# Patient Record
Sex: Female | Born: 1960 | Race: Black or African American | Hispanic: No | Marital: Single | Smoking: Never smoker
Health system: Southern US, Community
[De-identification: ages and names within clinical notes are randomized; demographics above are authoritative.]

## PROBLEM LIST (undated history)

## (undated) DIAGNOSIS — E139 Other specified diabetes mellitus without complications: Secondary | ICD-10-CM

## (undated) DIAGNOSIS — I10 Essential (primary) hypertension: Secondary | ICD-10-CM

## (undated) HISTORY — PX: BREAST SURGERY: SHX581

---

## 2005-11-19 ENCOUNTER — Emergency Department (HOSPITAL_COMMUNITY): Admission: EM | Admit: 2005-11-19 | Discharge: 2005-11-19 | Payer: Self-pay | Admitting: Emergency Medicine

## 2007-07-22 ENCOUNTER — Emergency Department (HOSPITAL_COMMUNITY): Admission: EM | Admit: 2007-07-22 | Discharge: 2007-07-22 | Payer: Self-pay | Admitting: Emergency Medicine

## 2011-02-08 LAB — URINE MICROSCOPIC-ADD ON

## 2011-02-08 LAB — URINE CULTURE: Colony Count: >=100000

## 2011-02-08 LAB — PREGNANCY, URINE: Preg Test, Ur: NEGATIVE

## 2011-02-08 LAB — URINALYSIS, ROUTINE W REFLEX MICROSCOPIC
Glucose, UA: NEGATIVE
Ketones, ur: NEGATIVE
Protein, ur: NEGATIVE
pH: 6

## 2013-01-26 ENCOUNTER — Emergency Department (HOSPITAL_COMMUNITY)
Admission: EM | Admit: 2013-01-26 | Discharge: 2013-01-26 | Disposition: A | Discharge status: Home / Self Care | Payer: Self-pay | Attending: Emergency Medicine | Admitting: Emergency Medicine

## 2013-01-26 ENCOUNTER — Encounter (HOSPITAL_COMMUNITY): Payer: Self-pay | Admitting: Cardiology

## 2013-01-26 DIAGNOSIS — S5010XA Contusion of unspecified forearm, initial encounter: Secondary | ICD-10-CM | POA: Insufficient documentation

## 2013-01-26 DIAGNOSIS — S5011XA Contusion of right forearm, initial encounter: Secondary | ICD-10-CM

## 2013-01-26 DIAGNOSIS — S0990XA Unspecified injury of head, initial encounter: Secondary | ICD-10-CM | POA: Insufficient documentation

## 2013-01-26 DIAGNOSIS — I10 Essential (primary) hypertension: Secondary | ICD-10-CM | POA: Insufficient documentation

## 2013-01-26 HISTORY — DX: Essential (primary) hypertension: I10
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
## 2013-01-26 NOTE — ED Notes (Addendum)
Pts whereabouts remain unknown. NP aware of pts likely elopement after being seen by the NP. Pt did not state intention to leave to any medical or nursing staff present.

## 2013-01-26 NOTE — ED Notes (Signed)
Upon entering pts room pt noted to not be in room. Immediate area searched for pt without success. Will continue to look for pt.
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
## 2013-01-27 NOTE — ED Provider Notes (Signed)
Medical screening examination/treatment/procedure(s) were performed by non-physician practitioner and as supervising physician I was immediately available for consultation/collaboration.   William Eutha Cude, MD 01/27/13 0041 

## 2014-07-01 ENCOUNTER — Ambulatory Visit
Admit: 2014-07-01 | Discharge: 2014-07-01 | Payer: PRIVATE HEALTH INSURANCE | Attending: Family Medicine | Primary: Family Medicine

## 2014-07-01 ENCOUNTER — Inpatient Hospital Stay: Admit: 2014-07-01 | Payer: MEDICAID | Primary: Family Medicine

## 2014-07-01 DIAGNOSIS — M79661 Pain in right lower leg: Secondary | ICD-10-CM

## 2014-07-01 DIAGNOSIS — M7989 Other specified soft tissue disorders: Secondary | ICD-10-CM

## 2014-07-01 MED ORDER — TRIMETHOPRIM-SULFAMETHOXAZOLE 160 MG-800 MG TAB
160-800 mg | ORAL_TABLET | Freq: Two times a day (BID) | ORAL | Status: AC
Start: 2014-07-01 — End: 2014-07-11

## 2014-07-01 MED ORDER — IBUPROFEN 600 MG TAB
600 mg | ORAL_TABLET | Freq: Four times a day (QID) | ORAL | Status: DC | PRN
Start: 2014-07-01 — End: 2014-07-09

## 2014-07-01 NOTE — Progress Notes (Signed)
HISTORY OF PRESENT ILLNESS  Leslie Cole is a 54 y.o. female.  Skin Problem  The history is provided by the patient. This is a new problem. The current episode started yesterday. The problem occurs constantly. The problem has been rapidly worsening. Pertinent negatives include no shortness of breath. Associated symptoms comments: Right lower leg is swelled and itchy. Nothing aggravates the symptoms. Nothing relieves the symptoms. She has tried nothing for the symptoms. The treatment provided no relief.   on right lower leg, noticed a place on back of leg that has has begun to rapidly swell since last night.  Pt denies any smoking, denies any birth control usage.  She does admit to driving from [place] in December.      Review of Systems   Constitutional: Negative for fever, chills and malaise/fatigue.   Respiratory: Negative.  Negative for shortness of breath.    Cardiovascular: Negative.    Gastrointestinal: Negative.    Musculoskeletal: Negative for myalgias.   Skin: Positive for itching. Negative for rash.   Neurological: Negative.  Negative for tingling and focal weakness.       BP 126/73 mmHg   Pulse 51   Temp(Src) 98.1 ??F (36.7 ??C) (Tympanic)   Resp 20   Ht 5\' 2"  (1.575 m)   Wt 248 lb 6.4 oz (112.674 kg)   BMI 45.42 kg/m2   SpO2 97%  Physical Exam   Constitutional: She appears well-developed and well-nourished. She is active and cooperative. She does not have a sickly appearance.   HENT:   Head: Normocephalic and atraumatic.   Eyes: No scleral icterus.   Cardiovascular: Normal rate, regular rhythm and normal heart sounds.    Pulmonary/Chest: Effort normal and breath sounds normal. No respiratory distress. She has no wheezes.   Musculoskeletal: She exhibits tenderness. She exhibits no edema.        Right lower leg: She exhibits tenderness and swelling. She exhibits no edema, no deformity and no laceration.        Legs:  Neurological: She is alert. She exhibits normal muscle tone.    Skin: Skin is warm and dry. No rash noted. There is erythema. No pallor.   Nursing note and vitals reviewed.    Discussed symptoms and condition with pt and recommended to her that we need to consider blood clot.  She desires to have LE US.  Pt will go to straight to [facility] for US tonight.  US called and they said that they would be there until 630pm.        ASSESSMENT and PLAN  Encounter Diagnoses   Name Primary?   ??? Pain and swelling of right lower leg Yes   ??? Cellulitis of right lower leg      Orders Placed This Encounter   ??? DUPLEX LOWER EXT VENOUS RIGHT   ??? trimethoprim-sulfamethoxazole (BACTRIM DS, SEPTRA DS) 160-800 mg per tablet   ??? ibuprofen (MOTRIN) 600 mg tablet     Follow-up Disposition:  Return if symptoms worsen or fail to improve.

## 2014-07-01 NOTE — Patient Instructions (Signed)
Pt to go to [facility] for [place] right LE  Fluids  Rest  Motrin or tylenol prn fever or pain  F/U with PCP in 3-5 days.  Should symptoms not improve or should they worsen, notify Primary Care Provider and/or go to ED  Take Rx as prescribed  Cellulitis: Care Instructions  Your Care Instructions     Cellulitis is a skin infection. It often occurs after a break in the skin from a scrape, cut, bite, or puncture, or after a rash.  The doctor has checked you carefully, but problems can develop later. If you notice any problems or new symptoms, get medical treatment right away.  Follow-up care is a key part of your treatment and safety. Be sure to make and go to all appointments, and call your doctor if you are having problems. It's also a good idea to know your test results and keep a list of the medicines you take.  How can you care for yourself at home?  ?? Take your antibiotics as directed. Do not stop taking them just because you feel better. You need to take the full course of antibiotics.  ?? Prop up the infected area on pillows to reduce pain and swelling. Try to keep the area above the level of your heart as often as you can.  ?? If your doctor told you how to care for your wound, follow your doctor's instructions. If you did not get instructions, follow this general advice:  ?? Wash the wound with clean water 2 times a day. Don't use hydrogen peroxide or alcohol, which can slow healing.  ?? You may cover the wound with a thin layer of petroleum jelly, such as Vaseline, and a nonstick bandage.  ?? Apply more petroleum jelly and replace the bandage as needed.  ?? Be safe with medicines. Take pain medicines exactly as directed.  ?? If the doctor gave you a prescription medicine for pain, take it as prescribed.  ?? If you are not taking a prescription pain medicine, ask your doctor if you can take an over-the-counter medicine.  To prevent cellulitis in the future   ?? Try to prevent cuts, scrapes, or other injuries to your skin. Cellulitis most often occurs where there is a break in the skin.  ?? If you get a scrape, cut, mild burn, or bite, wash the wound with clean water as soon as you can to help avoid infection. Don't use hydrogen peroxide or alcohol, which can slow healing.  ?? If you have swelling in your legs (edema), support stockings and good skin care may help prevent leg sores and cellulitis.  ?? Take care of your feet, especially if you have diabetes or other conditions that increase the risk of infection. Wear shoes and socks. Do not go barefoot. If you have athlete's foot or other skin problems on your feet, talk to your doctor about how to treat them.  When should you call for help?  Call your doctor now or seek immediate medical care if:  ?? You have signs that your infection is getting worse, such as:  ?? Increased pain, swelling, warmth, or redness.  ?? Red streaks leading from the area.  ?? Pus draining from the area.  ?? A fever.  ?? You get a rash.  Watch closely for changes in your health, and be sure to contact your doctor if:  ?? You are not getting better after 1 day (24 hours).  ?? You do not get better as  expected.   Where can you learn more?   Go to [email or web address]  Enter X309 in the search box to learn more about "Cellulitis: Care Instructions."   ?? 2006-2015 [employer]. Care instructions adapted under license by [employer] (which disclaims liability or warranty for this information). This care instruction is for use with your licensed healthcare professional. If you have questions about a medical condition or this instruction, always ask your healthcare professional. [employer]wise, Incorporated disclaims any warranty or liability for your use of this information.  Content Version: 10.7.482551; Current as of: July 06, 2013              Learning About Deep Vein Thrombosis  What is deep vein thrombosis?      A deep vein thrombosis (DVT) is a blood clot in certain veins of the legs, pelvis, or arms. The clot is usually in the legs. DVT may damage the vein and cause the area to ache, swell, and change color. DVT also can lead to sores.  DVT in these veins needs to be treated because the clots can get bigger, break loose, and travel through the bloodstream to the lungs. A blood clot in a lung can cause death.  Blood clots can form in the veins when you are not active for a long period of time. For example, they can form if you need to stay in bed because of a health problem or must sit for a long time on an airplane or in a car. Surgery or an injury can damage your blood vessels and cause a clot to form. Cancer also can cause DVT. And some people have blood that clots too easily, which is a problem that may run in families.  A risk factor is something that makes you more likely to develop a disease.  Here are some major risk factors for DVT:  ?? You have surgery.  ?? You have to stay in bed for more than 3 days (such as in the hospital).  ?? Your blood is likely to clot because of an injury, cancer, or inherited condition.  Here are some minor risk factors for DVT:  ?? You take birth control hormones.  ?? You are pregnant.  ?? You are in a car or airplane for a long trip.  What are the symptoms?  Symptoms of DVT may include:  ?? Swelling in the affected area.  ?? Redness and warmth in the affected area.  ?? Pain or tenderness. You may have pain only when you touch the affected area or when you stand or walk.  If your doctor thinks you may have DVT, you will probably have an ultrasound test. You may have other tests as well.  How can you prevent DVT?  ?? Exercise your lower leg muscles to help blood flow in your legs. Point your toes up toward your head so the calves of your legs are stretched, then relax and repeat. This is a good exercise to do when you are sitting for long periods of time.   ?? Get out of bed as soon as you can after an illness or surgery. If you need to stay in bed, do the leg exercise noted above every hour when you are awake.  ?? Use special stockings called compression stockings. These stockings are tight at the feet with a gradually looser fit on the leg. Many doctors recommend that you wear compression stockings during a journey longer than 8 hours.  ?? Take breaks when you  are on long trips. Stop the car and walk around. On long airplane flights, walk up and down the aisle hourly, flex and point your feet every 20 minutes while sitting, and drink plenty of water.  ?? Take blood-thinning medicines after some types of surgery if your doctor recommends it. Blood thinners also may be used if you are likely to develop clots.  How is DVT treated?  Treatment for DVT usually involves taking blood thinners. These medicines are given through a vein (intravenously, or IV) or as a pill. Talk with your doctor about which medicine is right for you.  Your doctor also may suggest that you prop up or elevate your leg when possible, take walks, and wear compression stockings. These measures may help reduce the pain and swelling that can happen with DVT.  Follow-up care is a key part of your treatment and safety. Be sure to make and go to all appointments, and call your doctor if you are having problems. It's also a good idea to know your test results and keep a list of the medicines you take.   Where can you learn more?   Go to [email or web address]  Enter X941 in the search box to learn more about "Learning About Deep Vein Thrombosis."   ?? 2006-2015 [employer], [employer]. Care instructions adapted under license by [employer] (which disclaims liability or warranty for this information). This care instruction is for use with your licensed healthcare professional. If you have questions about a medical condition or this instruction, always ask your healthcare professional. [employer]  [employer]corpor[employer] disclaims any warranty or liability for your use of this information.  Content Version: 10.7.482551; Current as of: January 04, 2014              Deep Vein Thrombosis: Care Instructions  Your Care Instructions     A deep vein thrombosis (DVT) is a blood clot in certain veins of the legs, pelvis, or arms. Blood clots in these veins need to be treated because they can get bigger, break loose, and travel through the bloodstream to the lungs. A blood clot in a lung can be life-threatening.  The doctor may have given you a blood thinner (anticoagulant). A blood thinner can stop the blood clot from growing larger and prevent new clots from forming. You will need to take a blood thinner for 3 to 6 months or longer.  The doctor has checked you carefully, but problems can develop later. If you notice any problems or new symptoms, get medical treatment right away.  Follow-up care is a key part of your treatment and safety. Be sure to make and go to all appointments, and call your doctor if you are having problems. It's also a good idea to know your test results and keep a list of the medicines you take.  How can you care for yourself at home?  ?? Take your medicines exactly as prescribed. Call your doctor if you think you are having a problem with your medicine.  ?? If you are taking a blood thinner, be sure you get instructions about how to take your medicine safely. Blood thinners can cause serious bleeding problems.  ?? Wear compression stockings if your doctor recommends them. These stockings are tighter at the feet than on the legs. They may reduce pain and swelling in your legs. You can get them with a prescription at a medical supply store or at some drugstores.  ?? Raise your leg or arm on a pillow when sitting.  Try to keep it above the level of your heart.  ?? Use a heating pad for 20 to 30 minutes 3 or 4 times a day to reduce pain and swelling.  When should you call for help?   Call 911 anytime you think you may need emergency care. For example, call if:  ?? You passed out (lost consciousness).  ?? You have symptoms of a blood clot in your lung (called a pulmonary embolism). These include:  ?? Sudden chest pain.  ?? Trouble breathing.  ?? Coughing up blood.  Call your doctor now or seek immediate medical care if:  ?? You have new or worse trouble breathing.  ?? You are dizzy or lightheaded, or you feel like you may faint.  ?? You have symptoms of a blood clot in your arm or leg. These may include:  ?? Pain in the arm, calf, back of the knee, thigh, or groin.  ?? Redness and swelling in the arm, leg, or groin.  Watch closely for changes in your health, and be sure to contact your doctor if:  ?? You do not get better as expected.   Where can you learn more?   Go to [email or web address]  Enter [phone] in the search box to learn more about "Deep Vein Thrombosis: Care Instructions."   ?? 2006-2015 [employer]. Care instructions adapted under license by [employer] (which disclaims liability or warranty for this information). This care instruction is for use with your licensed healthcare professional. If you have questions about a medical condition or this instruction, always ask your healthcare professional. [employer], [employer] disclaims any warranty or liability for your use of this information.  Content Version: 10.7.482551; Current as of: January 04, 2014              Learning About How to Prevent Blood Clots  What is a blood clot?     A blood clot is a clump of blood that forms in a blood vessel, such as a vein or an artery. If a clot gets stuck in a blood vessel, it can cause serious problems like a deep vein thrombosis (DVT) or a pulmonary embolism.  A DVT is a blood clot in certain veins of the legs, pelvis, or arms. It most often occurs in the legs. Blood clots in these veins need to be treated, because they can get bigger, break loose, and travel through the  bloodstream to the heart and then to the lungs. This causes a pulmonary embolism.  A pulmonary embolism is a sudden blockage of an artery in the lung. Blood clots in the deep veins of the leg are the most common cause of a pulmonary embolism. In many cases, the clots are small. They may damage the lung. But if the clot is large and stops blood flow to the lung, it can be deadly.  What increases your risk for blood clots?  Some of the things that can increase your risk for a blood clot include:  Slowed blood flow  When blood doesn't flow normally, clots are more likely to develop. Reduced blood flow may result from long-term bed rest, such as after a surgery, injury, or serious illness. Or it may result from sitting for a long time, especially when traveling long distances.  Abnormal clotting  Some people have blood that clots too easily or too quickly. Problems that may cause increased clotting include:  ?? Having certain blood problems that make blood clot too easily. This is a problem that  may run in families.  ?? Having certain health problems, such as cancer, heart failure, stroke, or severe infection.  ?? Being pregnant. A woman's risk of getting blood clots increases both during pregnancy and shortly after delivery or after a cesarean section.  ?? Using hormonal forms of birth control or hormone therapy.  ?? Smoking.  Injury to the blood vessel wall  Blood is more likely to clot in veins and arteries shortly after they are injured. Injury can be caused by a recent medical procedure or surgery that involved your legs, hips, belly, or brain. Or it can be caused by an injury, such as a broken hip.  What can you do to prevent blood clots?  After any procedure or event that increases your risk  ?? Take a blood-thinning medicine (called an anticoagulant) as directed if your doctor prescribes one.  ?? Exercise  your lower leg muscles to help keep the blood moving through  your legs. Point your toes up toward your head so the calves of your legs are stretched, then relax. Repeat. This is a good exercise to do when you are sitting for long periods of time.  ?? Get up out of bed as soon as you safely can or as soon as your doctor says it's okay after an illness or surgery. If you can't get out of bed, you can do the leg exercise described above. Try to do this leg exercise every hour when you are awake. This will help keep the blood moving through your legs. If you are in the hospital and need to stay in bed, your doctor may have you use a special device that inflates and deflates knee-high boots to help keep blood from pooling in your legs.  ?? Use compression stockings if your doctor prescribes them. These are specially fitted stockings that may prevent blood clots by keeping blood from pooling in your legs.  When you travel  ?? Take breaks when you travel. On long car trips, stop the car and walk around every hour or so. On long bus or train rides or plane flights, get out of your seat and walk up and down the aisle every hour or so.  ?? Do leg exercises while you are seated. For example, pump your feet up and down by pulling your toes up toward your knees and then pointing them down.  If you already have a risk of blood clots, talk to your doctor before taking a long trip. Your doctor may want you to wear compression stockings or take blood-thinning medicine.  Take care of your body  ?? Be active. Try to get 30 minutes or more of activity on most days of the week.  ?? Don't smoke. Smoking can increase your risk of blood clots. If you need help quitting, talk to your doctor about stop-smoking programs and medicines.  ?? Check with your doctor about whether you should use hormonal forms of birth control or hormone therapy. These may increase your risk of blood clots.  When should you call for help?  Call 911 anytime you think you may need emergency care. For example, call if:   ?? You have symptoms of a blood clot in your lung (called a pulmonary embolism). These may include:  ?? Sudden chest pain.  ?? Trouble breathing.  ?? Coughing up blood.  Call your doctor now or seek immediate medical care if:  ?? You have symptoms of a blood clot in your arm or leg (called a  deep vein thrombosis). These may include:  ?? Pain in the arm, calf, back of the knee, thigh, or groin.  ?? Redness and swelling in the arm, leg, or groin.   Where can you learn more?   Go to [email or web address]  Enter F229 in the search box to learn more about "Learning About How to Prevent Blood Clots."   ?? 2006-2015 [employer]. Care instructions adapted under license by [employer] (which disclaims liability or warranty for this information). This care instruction is for use with your licensed healthcare professional. If you have questions about a medical condition or this instruction, always ask your healthcare professional. [employer] disclaims any warranty or liability for your use of this information.  Content Version: 10.7.482551; Current as of: January 04, 2014

## 2014-07-09 ENCOUNTER — Ambulatory Visit
Admit: 2014-07-09 | Discharge: 2014-07-09 | Payer: PRIVATE HEALTH INSURANCE | Attending: Family | Primary: Family Medicine

## 2014-07-09 ENCOUNTER — Inpatient Hospital Stay: Admit: 2014-07-09 | Payer: MEDICAID | Primary: Family Medicine

## 2014-07-09 NOTE — Progress Notes (Addendum)
[facility]- [facility] Progress Note                      Domingo Pulse, APRN    Leslie Cole 54 y.o. female   [id number]   [place]:  07/09/2014    Chief Complaint(s):   Chief Complaint   Patient presents with   ??? 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 She noted a hx of sleep apnea, She also has c/o gerd symptoms as well as reflux with actual food in mouth in the night that wakes her up. She is concerned about her obesity and would like to lose weight. She has a goal of 200 lb. She has had some stressful relationships that she states has gained weight as a result. The pt states she has not had menses in over a year. She has hot flashes, mood swings, and increased appetite.  Highest hgba1c in the past has been elevated.        Review of Systems:  Fever: no  Runny Nose/Congestion: no   Congested:  no  Cough:   no  Wheezing/CP/SOB: no  Otalgia: no  Sore Throat:   no  Headache:  no   Nausea/Vomiting:    no  Diarrhea:   no  Constipation:  occasional  Dysuria:   no  Rash:  no  Appetite: unchanged  Fluid Intake:  unchanged  Urine Output: unchanged     Past Medical History:    Past Medical History   Diagnosis Date   ??? Diabetes 1.5, managed as type 2 ([place])    ??? Hyperlipidemia        Allergies: No Known Allergies    Family History Reviewed at This Visit: family history includes Cancer in her mother, sister, and sister; Diabetes in her father and mother; Heart Disease in her father and mother. There is no history of Lung Disease.    Social History Reviewed at This Visit:  reports that she has never smoked. She has never used smokeless tobacco. She reports that she does not drink alcohol or use illicit drugs.    Review of Systems:  A thorough review of systems was obtained and negative except for those elements described in the history of present illness.      Vital Signs and Percentiles for Age:   Temp:98.6 ??F (37 ??C) (Tympanic),  Pulse: 79, Resp: 18, BP: 141/83,   Wt: 243 lb (110.224 kg),  Ht: $Re'5\' 2"'gNu$  (1.575 m)    Physical Examination:     General: Resting comfortably, not in any distress    HEENT:  Normocephalic, atraumatic, TMs clear bilaterally, oropharynx without significant erythema and tonsillar pillars appear normal bilaterally and mucous membranes are moist.    CV:  RRR, nl S1S2 without murmur    Resp: CTAB    Ab: Soft, no tenderness, no distension, +BS without masses or organomegaly    Ext: 2+ bilat femoral pulses and moving all ext without gross deficit    Skin: no rashes noted, area on the right lower leg is healed, capillary refill at bilat ankles <2sec    Lab Results   Component Value Date/Time    WBC 4.4 07/09/2014 06:50 PM    HGB 15.1 07/09/2014 06:50 PM    HCT 45.4 07/09/2014 06:50 PM    PLATELET 250 07/09/2014 06:50 PM    MCV 86.8 07/09/2014  06:50 PM     Lab Results   Component Value Date/Time    TSH 3.24 07/09/2014 06:50 PM    T4, FREE 1.0 07/09/2014 06:50 PM     Lab Results   Component Value Date/Time    SODIUM 138 07/09/2014 06:50 PM    POTASSIUM 4.8 07/09/2014 06:50 PM    CHLORIDE 104 07/09/2014 06:50 PM    CO2 26 07/09/2014 06:50 PM    ANION GAP 8 07/09/2014 06:50 PM    GLUCOSE 88 07/09/2014 06:50 PM    BUN 16 07/09/2014 06:50 PM    CREATININE 1.18 07/09/2014 06:50 PM    BUN/CREATININE RATIO 14 07/09/2014 06:50 PM    GFR EST AA 58 07/09/2014 06:50 PM    GFR EST NON-AA 48 07/09/2014 06:50 PM    CALCIUM 9.5 07/09/2014 06:50 PM    BILIRUBIN, TOTAL 0.6 07/09/2014 06:50 PM    ALT 37 07/09/2014 06:50 PM    AST 20 07/09/2014 06:50 PM    ALK. PHOSPHATASE 120 07/09/2014 06:50 PM    PROTEIN, TOTAL 8.6 07/09/2014 06:50 PM    ALBUMIN 4.3 07/09/2014 06:50 PM    GLOBULIN 4.3 07/09/2014 06:50 PM    A-G RATIO 1.0 07/09/2014 06:50 PM     Lab Results   Component Value Date/Time    VITAMIN D 25-HYDROXY 7.0 07/09/2014 06:50 PM       Impression: 54 y.o. female presenting with concern of morbid obesity,  elevated hgba1c       Plan:    Continue to monitor for depression  No whites diet  Labs today and will call with results  Follow up in 2 weeks  Increase exercise to at least 30 minutes daily  6 glasses of water daily  Vitamin d 50,000 units weekly x 8 weeks- repeat lab in last April 2016  Metabolic syndrome- Start metformin 500 mg po bid  Weight loss/life style modification- to decrease hgba1c and lipids    The following medications and orders have been prescribed at today's visit:     The patient's total current list of medications that we are recommending (including the new prescription) is as follows:  Current Outpatient Prescriptions   Medication Sig Dispense Refill   ??? trimethoprim-sulfamethoxazole (BACTRIM DS, SEPTRA DS) 160-800 mg per tablet Take 1 Tab by mouth two (2) times a day for 10 days. 20 Tab 0       1. We have furthermore cautioned the patient and/or family that if the condition deteriorates or significantly changes in any way to immediately seek out the help of a medical professional/emergency room or call 911.    2. Follow up: we have recommended follow-up as directed and dileanated on the discharge information portion of our electronic medical record.  We have also emphasized more urgent follow up with either at the urgent care/emergency department locally sooner for further exacerbations/worsening of acute condition, or any other further concerns the patient or family may have.    Signed:       Lovell Sheehan, APRN       07/09/2014       2:01 PM

## 2014-07-10 LAB — CBC WITH AUTOMATED DIFF
ABS. BASOPHILS: 0 10*3/uL (ref 0.0–0.1)
ABS. EOSINOPHILS: 0 10*3/uL (ref 0.0–0.5)
ABS. LYMPHOCYTES: 2.5 10*3/uL (ref 0.8–3.5)
ABS. MONOCYTES: 0.4 10*3/uL — ABNORMAL LOW (ref 0.8–3.5)
ABS. NEUTROPHILS: 1.5 10*3/uL (ref 1.5–8.0)
BASOPHILS: 1 % (ref 0–2)
EOSINOPHILS: 1 % (ref 0–5)
HCT: 45.4 % (ref 41–53)
HGB: 15.1 g/dL (ref 12.0–16.0)
LYMPHOCYTES: 56 % — ABNORMAL HIGH (ref 19–48)
MCH: 28.9 PG (ref 27–31)
MCHC: 33.3 g/dL (ref 31–37)
MCV: 86.8 FL (ref 80–100)
MONOCYTES: 8 % (ref 3–9)
MPV: 11.8 FL — ABNORMAL HIGH (ref 5.9–10.3)
NEUTROPHILS: 34 % — ABNORMAL LOW (ref 40–74)
PLATELET: 250 10*3/uL (ref 130–400)
RBC: 5.23 M/uL (ref 4.2–5.4)
RDW: 13.8 % (ref 11.5–14.5)
WBC: 4.4 10*3/uL — ABNORMAL LOW (ref 4.5–10.8)

## 2014-07-10 LAB — METABOLIC PANEL, COMPREHENSIVE
A-G Ratio: 1 — ABNORMAL LOW (ref 1.2–2.2)
ALT (SGPT): 37 U/L (ref 12–78)
AST (SGOT): 20 U/L (ref 15–37)
Albumin: 4.3 g/dL (ref 3.4–5.0)
Alk. phosphatase: 120 U/L — ABNORMAL HIGH (ref 45–117)
Anion gap: 8 mmol/L (ref 6–15)
BUN/Creatinine ratio: 14 (ref 7–25)
BUN: 16 MG/DL (ref 7–18)
Bilirubin, total: 0.6 MG/DL (ref <1.1)
CO2: 26 mmol/L (ref 21–32)
Calcium: 9.5 MG/DL (ref 8.5–10.1)
Chloride: 104 mmol/L (ref 98–107)
Creatinine: 1.18 MG/DL (ref 0.60–1.30)
GFR est AA: 58 mL/min/{1.73_m2} — ABNORMAL LOW (ref >60)
GFR est non-AA: 48 mL/min/{1.73_m2} — ABNORMAL LOW (ref >60)
Globulin: 4.3 g/dL — ABNORMAL HIGH (ref 2.4–3.5)
Glucose: 88 mg/dL (ref 70–110)
Potassium: 4.8 mmol/L (ref 3.5–5.3)
Protein, total: 8.6 g/dL — ABNORMAL HIGH (ref 6.4–8.2)
Sodium: 138 mmol/L (ref 136–145)

## 2014-07-10 LAB — T4, FREE: T4, Free: 1 NG/DL (ref 0.76–1.46)

## 2014-07-10 LAB — VITAMIN D, 25 HYDROXY: Vitamin D 25-Hydroxy: 7 ng/mL — ABNORMAL LOW (ref 30–80)

## 2014-07-10 LAB — LIPID PANEL
Cholesterol, total: 219 MG/DL — ABNORMAL HIGH (ref <200)
HDL Cholesterol: 38 MG/DL (ref 32–96)
LDL, calculated: 160.68 MG/DL — ABNORMAL HIGH (ref <130)
Triglyceride: 127 MG/DL (ref <150)
VLDL, calculated: 25.4 MG/DL (ref 5–32)

## 2014-07-10 LAB — TSH 3RD GENERATION: TSH: 3.24 u[IU]/mL (ref 0.35–3.74)

## 2014-07-10 LAB — HEMOGLOBIN A1C WITH EAG: Hemoglobin A1c: 6.5 % — ABNORMAL HIGH (ref 4.2–6.3)

## 2014-07-15 MED ORDER — METFORMIN 500 MG TAB
500 mg | ORAL_TABLET | Freq: Two times a day (BID) | ORAL | Status: DC
Start: 2014-07-15 — End: 2014-07-26

## 2014-07-15 MED ORDER — ERGOCALCIFEROL (VITAMIN D2) 50,000 UNIT CAP
1250 mcg (50,000 unit) | ORAL_CAPSULE | ORAL | Status: AC
Start: 2014-07-15 — End: 2014-09-03

## 2014-07-15 NOTE — Progress Notes (Signed)
Quick Note:        Please inform the pt that she does have an elevated a1c and she needs to start metformin at this time for that. I will start a low dose and let her get used to it then increase after we meet again. Also, inform her that she has a very low vitaim D level and I need to to take supplement once a week for 4 weeks then refill the script for another 4 weeks and we will retest then. Tell her to keep follow up appointment.    ______

## 2014-07-15 NOTE — Addendum Note (Signed)
Addended by: Lovell SheehanOWE, Garvin Ellena on: 07/15/2014 12:55 PM      Modules accepted: Orders

## 2014-07-23 ENCOUNTER — Encounter: Attending: Family | Primary: Family Medicine

## 2014-07-26 ENCOUNTER — Ambulatory Visit
Admit: 2014-07-26 | Discharge: 2014-07-26 | Payer: PRIVATE HEALTH INSURANCE | Attending: Family | Primary: Family Medicine

## 2014-07-26 MED ORDER — METFORMIN 500 MG TAB
500 mg | ORAL_TABLET | Freq: Two times a day (BID) | ORAL | Status: DC
Start: 2014-07-26 — End: 2014-09-06

## 2014-07-26 NOTE — Progress Notes (Signed)
[facility] Primary Care- [facility] Progress Note                      Lovell Sheehan, APRN    Amyri Frenz 54 y.o. female   [id number]   DOV:  07/26/2014    Chief Complaint(s):   Chief Complaint   Patient presents with   ??? Follow-up        History of Present Illness: The pt presents for follow up for type 2 diabetes. The pt. Has not started medications or diet. She is going to [employer] for work for two months. She does not like to take pills because they are hard for her to swallow. She has not been exercising.     Review of Systems:  Fever: no  Runny Nose/Congestion: no   Congested:  no  Cough:   no  Wheezing/CP/SOB: no  Otalgia: no  Sore Throat:   no  Headache:  no   Nausea/Vomiting:    no  Diarrhea:   no  Constipation:   no  Dysuria:   no  Rash:  no  Appetite: unchanged  Fluid Intake:  unchanged  Urine Output: unchanged     Past Medical History:    Past Medical History   Diagnosis Date   ??? Diabetes 1.5, managed as type 2 ([facility])    ??? Hyperlipidemia        Allergies: No Known Allergies    Family History Reviewed at This Visit: family history includes Cancer in her mother, sister, and sister; Diabetes in her father and mother; Heart Disease in her father and mother. There is no history of Lung Disease.    Social History Reviewed at This Visit:  reports that she has never smoked. She has never used smokeless tobacco. She reports that she does not drink alcohol or use illicit drugs.    Review of Systems:  A thorough review of systems was obtained and negative except for those elements described in the history of present illness.      Vital Signs and Percentiles for Age:  Temp:98.3 ??F (36.8 ??C) (Tympanic),  Pulse: 76, Resp: 18, BP: 135/71,   Wt: 246 lb 9.6 oz (111.857 kg),  Ht:  (1.575 m)    Physical Examination:     General: Resting comfortably, not in any distress    HEENT:  Normocephalic, atraumatic,mucous membranes are moist.    CV:  RRR, nl S1S2 without murmur    Resp: CTAB    Ab: Soft, no c/o pain     Ext: 2+ bilat radial pulses and moving all ext without gross deficit    Skin: no rashes noted, capillary refill at bilat ankles <2sec    Greater then 45 min with patient face to face counseling diet and obesity    We have a lengthy discussion with the patient regarding the overall risk factors in [place] along with increased risk of early-onset diabetes, hypertension, sleep apnea, and other significant sequelae and have recommended significant dietary changes with regards to management of this problem      Impression: 54 y.o. female presenting with concern of type 2 diabetes and obesity    Plan:    Metformin 500 po bid  NO WHITES diet  Increase water to 6 8oz bottles daily  Increase exercise to at least 5 days a week      The following medications and orders have been prescribed at today's visit:     The patient's total current list of  medications that we are recommending (including the new prescription) is as follows:  Current Outpatient Prescriptions   Medication Sig Dispense Refill   ??? metFORMIN (GLUCOPHAGE) 500 mg tablet Take 1 Tab by mouth two (2) times daily (with meals). Indications: TYPE 2 DIABETES MELLITUS 60 Tab 1   ??? ergocalciferol (ERGOCALCIFEROL) 50,000 unit capsule Take 1 Cap by mouth every seven (7) days for 8 doses. Indications: VITAMIN D DEFICIENCY 4 Cap 1       1. We have furthermore cautioned the patient and/or family that if the condition deteriorates or significantly changes in any way to immediately seek out the help of a medical professional/emergency room or call 911.    2. Follow up: we have recommended follow-up as directed and dileanated on the discharge information portion of our electronic medical record.  We have also emphasized more urgent follow up with either at the urgent care/emergency department locally sooner for further exacerbations/worsening of acute condition, or any other further concerns the patient or family may have.    Signed:       Lovell SheehanKristina Johannah Rozas, NP        07/26/2014       2:10 PM

## 2014-07-26 NOTE — Patient Instructions (Signed)
Oral Therapy for Type 2 Diabetes: Care Instructions  Your Care Instructions  People with type 2 diabetes have two problems that lead to high blood sugar. Their bodies don't make enough insulin to control their blood sugar. And their bodies don't respond well to insulin when it is present. Oral medicines for type 2 diabetes can raise your insulin. They also help your body use insulin better. You take these drugs by mouth. Sometimes they are combined with insulin.  Some examples of these medicines are:  ?? Sulfonylureas. These help the body make more insulin. They include glipizide and glyburide.  ?? Meglitinides. These also help your body make insulin. They include nateglinide and repaglinide.  ?? Metformin. This lowers how much glucose your liver makes. And it helps you respond better to insulin.  ?? Thiazolidinediones. These also reduce the amount of blood glucose. They also help you respond better to insulin. These medicines include pioglitazone and rosiglitazone.  ?? Alpha-glucosidase inhibitors. These keep starches from breaking down. This means that they lower the amount of glucose absorbed when you eat. They include acarbose and miglitol.  ?? DPP-4 inhibitors. These help the body make more insulin. They also help the body make less of a hormone that raises blood sugar. They include linagliptin, saxagliptin, and sitagliptin.  ?? Sodium glucose co-transporter 2 inhibitors. These help to remove extra glucose through your urine. They may also help some people lose weight. These medicines are also called SGLT2 inhibitors. They include canagliflozin, dapagliflozin, and empagliflozin.  You may take one or more of these drugs. You and your doctor can choose the best ones for you. If pills can't control your blood sugar, you may need to use medicine that you take as a shot. These include amylinomimetics, incretin mimetics, and insulin.  You may need to use insulin for a short time if you get sick or need  surgery. Drugs that you take by mouth may not control blood sugar when your body is stressed.  Follow-up care is a key part of your treatment and safety. Be sure to make and go to all appointments, and call your doctor if you are having problems. It's also a good idea to know your test results and keep a list of the medicines you take.  How can you care for yourself at home?  ?? Eat a healthy diet. Get some exercise each day. This may help you to reduce how much medicine you need.  ?? Do not take other prescription or over-the-counter medicines, vitamins, herbal products, or supplements without talking to your doctor first. Some medicines for type 2 diabetes can cause problems with other medicines or supplements.  ?? Tell your doctor if you plan to get pregnant. Some of these drugs are not safe for pregnant women.  ?? Be safe with medicines. Take your medicines exactly as prescribed. Sulfonylureas or meglitinides can cause your blood sugar to drop very low. Call your doctor if you think you are having a problem with your medicine.  ?? Check your blood sugar levels often. You can use a glucose monitor. Keeping track of blood glucose levels can help you know how certain foods, activities, and medicines affect your blood sugar. And it can help you keep your blood sugar from getting so low that it's not safe.  When should you call for help?  Call 911 anytime you think you may need emergency care. For example, call if:  ?? You passed out (lost consciousness), or you suddenly become very sleepy or   confused. (You may have very low blood sugar.)  ?? You have symptoms of high blood sugar, such as:  ?? Blurred vision.  ?? Trouble staying awake or being woken up.  ?? Fast, deep breathing.  ?? Breath that smells fruity.  ?? Belly pain, not feeling hungry, and vomiting.  ?? Feeling confused.  Call your doctor now or seek immediate medical care if:  ?? You are sick and cannot control your blood sugar.   ?? You have been vomiting or have had diarrhea for more than 6 hours.  ?? Your blood sugar stays higher than the level your doctor has set for you.  ?? You have symptoms of low blood sugar, such as:  ?? Sweating.  ?? Feeling nervous, shaky, and weak.  ?? Extreme hunger and slight nausea.  ?? Dizziness and headache.  ?? Blurred vision.  ?? Confusion.  Watch closely for changes in your health, and be sure to contact your doctor if:  ?? You have a hard time knowing when your blood sugar is low.  ?? You have trouble keeping your blood sugar in the target range.  ?? You often have problems controlling your blood sugar.  ?? You have symptoms of long-term diabetes problems, such as:  ?? New vision changes.  ?? New pain, numbness, or tingling in your hands or feet.  ?? Skin problems.   Where can you learn more?   Go to http://www.healthwise.net/[facility]  Enter H153 in the search box to learn more about "Oral Therapy for Type 2 Diabetes: Care Instructions."   ?? 2006-2015 Healthwise[employer]. Care instructions adapted under license by  (which disclaims liability or warranty for this information). This care instruction is for use with your licensed healthcare professional. If you have questions about a medical condition or this instruction, always ask your healthcare professional. Healthwise, Incorporated disclaims any warranty or liability for your use of this information.  Content Version: 10.7.482551; Current as of: Oct 05, 2013              Learning About Diabetes Food Guidelines  Your Care Instructions  Meal planning is important to manage diabetes. It helps keep your blood sugar at a target level (which you set with your doctor). You don't have to eat special foods. You can eat what your family eats, including sweets once in a while. But you do have to pay attention to how often you eat and how much you eat of certain foods.  You may want to work with a dietitian or a certified diabetes educator  (CDE) to help you plan meals and snacks. A dietitian or CDE can also help you lose weight if that is one of your goals.  What should you know about eating carbs?  Managing the amount of carbohydrate (carbs) you eat is an important part of healthy meals when you have diabetes. Carbohydrate is found in many foods.  ?? Learn which foods have carbs. And learn the amounts of carbs in different foods.  ?? Bread, cereal, pasta, and rice have about 15 grams of carbs in a serving. A serving is 1 slice of bread (1 ounce), ?? cup of cooked cereal, or 1/3 cup of cooked pasta or rice.  ?? Fruits have 15 grams of carbs in a serving. A serving is 1 small fresh fruit, such as an apple or orange; ?? of a banana; ?? cup of cooked or canned fruit; ?? cup of fruit juice; 1 cup of melon or raspberries; or 2   tablespoons of dried fruit.  ?? Milk and no-sugar-added yogurt have 15 grams of carbs in a serving. A serving is 1 cup of milk or 2/3 cup of no-sugar-added yogurt.  ?? Starchy vegetables have 15 grams of carbs in a serving. A serving is ?? cup of mashed potatoes or sweet potato; 1 cup winter squash; ?? of a small baked potato; ?? cup of cooked beans; or ?? cup cooked corn or green peas.  ?? Learn how much carbs to eat each day and at each meal. A dietitian or CDE can teach you how to keep track of the amount of carbs you eat. This is called carbohydrate counting.  ?? If you are not sure how to count carbohydrate grams, use the Plate Method to plan meals. It is a good, quick way to make sure that you have a balanced meal. It also helps you spread carbs throughout the day.  ?? Divide your plate by types of foods. Put non-starchy vegetables on half the plate, meat or other protein food on one-quarter of the plate, and a grain or starchy vegetable in the final quarter of the plate. To this you can add a small piece of fruit and 1 cup of milk or yogurt, depending on how many carbs you are supposed to eat at a meal.   ?? Try to eat about the same amount of carbs at each meal. Do not "save up" your daily allowance of carbs to eat at one meal.  ?? Proteins have very little or no carbs per serving. Examples of proteins are beef, chicken, turkey, fish, eggs, tofu, cheese, cottage cheese, and peanut butter. A serving size of meat is 3 ounces, which is about the size of a deck of cards. Examples of meat substitute serving sizes (equal to 1 ounce of meat) are 1/4 cup of cottage cheese, 1 egg, 1 tablespoon of peanut butter, and ?? cup of tofu.  How can you eat out and still eat healthy?  ?? Learn to estimate the serving sizes of foods that have carbohydrate. If you measure food at home, it will be easier to estimate the amount in a serving of restaurant food.  ?? If the meal you order has too much carbohydrate (such as potatoes, corn, or baked beans), ask to have a low-carbohydrate food instead. Ask for a salad or green vegetables.  ?? If you use insulin, check your blood sugar before and after eating out to help you plan how much to eat in the future.  ?? If you eat more carbohydrate at a meal than you had planned, take a walk or do other exercise. This will help lower your blood sugar.  What else should you know?  ?? Limit saturated fat, such as the fat from meat and dairy products. This is a healthy choice because people who have diabetes are at higher risk of heart disease. So choose lean cuts of meat and nonfat or low-fat dairy products. Use olive or canola oil instead of butter or shortening when cooking.  ?? Don't skip meals. Your blood sugar may drop too low if you skip meals and take insulin or certain medicines for diabetes.  ?? Check with your doctor before you drink alcohol. Alcohol can cause your blood sugar to drop too low. Alcohol can also cause a bad reaction if you take certain diabetes medicines.  Follow-up care is a key part of your treatment and safety. Be sure to make  and go to all appointments, and   call your doctor if you are having problems. It's also a good idea to know your test results and keep a list of the medicines you take.   Where can you learn more?   Go to http://www.healthwise.net/[facility]  Enter I147 in the search box to learn more about "Learning About Diabetes Food Guidelines."   ?? 2006-2015 Health[employer], [employer]. Care instructions adapted under license by [place] (which disclaims liability or warranty for this information). This care instruction is for use with your licensed healthcare professional. If you have questions about a medical condition or this instruction, always ask your healthcare professional. Healthwise, Inc[employer] disclaims any warranty or liability for your use of this information.  Content Version: 10.7.482551; Current as of: Oct 05, 2013

## 2014-07-29 ENCOUNTER — Encounter: Attending: Family | Primary: Family Medicine

## 2014-09-06 ENCOUNTER — Encounter

## 2014-09-06 MED ORDER — METFORMIN 500 MG TAB
500 mg | ORAL_TABLET | Freq: Two times a day (BID) | ORAL | Status: AC
Start: 2014-09-06 — End: ?

## 2018-06-01 ENCOUNTER — Ambulatory Visit (HOSPITAL_BASED_OUTPATIENT_CLINIC_OR_DEPARTMENT_OTHER)
Admission: RE | Admit: 2018-06-01 | Discharge: 2018-06-01 | Disposition: A | Discharge status: Home / Self Care | Payer: Federal, State, Local not specified - PPO | Source: Ambulatory Visit | Attending: Emergency Medicine | Admitting: Emergency Medicine

## 2018-06-01 ENCOUNTER — Ambulatory Visit (HOSPITAL_COMMUNITY)
Admission: RE | Admit: 2018-06-01 | Discharge: 2018-06-01 | Disposition: A | Discharge status: Home / Self Care | Payer: Federal, State, Local not specified - PPO | Source: Ambulatory Visit | Attending: Emergency Medicine | Admitting: Emergency Medicine

## 2018-06-01 ENCOUNTER — Emergency Department (HOSPITAL_COMMUNITY)
Admission: EM | Admit: 2018-06-01 | Discharge: 2018-06-01 | Disposition: A | Discharge status: Home / Self Care | Payer: Federal, State, Local not specified - PPO | Attending: Emergency Medicine | Admitting: Emergency Medicine

## 2018-06-01 ENCOUNTER — Other Ambulatory Visit: Payer: Self-pay

## 2018-06-01 ENCOUNTER — Encounter (HOSPITAL_COMMUNITY): Payer: Self-pay | Admitting: *Deleted

## 2018-06-01 DIAGNOSIS — R52 Pain, unspecified: Secondary | ICD-10-CM | POA: Diagnosis not present

## 2018-06-01 DIAGNOSIS — I10 Essential (primary) hypertension: Secondary | ICD-10-CM | POA: Diagnosis not present

## 2018-06-01 DIAGNOSIS — R209 Unspecified disturbances of skin sensation: Secondary | ICD-10-CM | POA: Insufficient documentation

## 2018-06-01 DIAGNOSIS — M79605 Pain in left leg: Secondary | ICD-10-CM | POA: Diagnosis present

## 2018-06-01 LAB — I-STAT CHEM 8, ED
BUN: 14 mg/dL (ref 6–20)
Calcium, Ion: 1.16 mmol/L (ref 1.15–1.40)
Chloride: 107 mmol/L (ref 98–111)
Creatinine, Ser: 0.8 mg/dL (ref 0.44–1.00)
GLUCOSE: 127 mg/dL — AB (ref 70–99)
HEMATOCRIT: 40 % (ref 36.0–46.0)
HEMOGLOBIN: 13.6 g/dL (ref 12.0–15.0)
Potassium: 3.8 mmol/L (ref 3.5–5.1)
Sodium: 141 mmol/L (ref 135–145)
TCO2: 24 mmol/L (ref 22–32)

## 2018-06-01 NOTE — Progress Notes (Signed)
Bilateral lower extremity venous duplex has been completed.   Preliminary results in CV Proc.   Blanch Media 06/01/2018 8:10 AM

## 2018-06-01 NOTE — ED Provider Notes (Signed)
Emergency Department Provider Note   I have reviewed the triage vital signs and the nursing notes.   HISTORY  Chief Complaint Leg Pain (bilateral)   HPI Karla Knight is a 58 y.o. female with medical problems as documented below the presents the emergency department today secondary to bilateral lower extremity pain.  Patient states that she does not really have pain there she has abnormal sensation of something moving around there.  Sound like it is a throbbing that comes and goes in different areas.  Not related to movement necessarily.  Is been going on for last couple weeks after a long car trip.  Also has recently been exercising more and changed her diet is a weight loss attempt.  No rashes.  No trauma. No other associated or modifying symptoms.    Past Medical History:  Diagnosis Date  . Hypertension     There are no active problems to display for this patient.   History reviewed. No pertinent surgical history.    Allergies Patient has no known allergies.  No family history on file.  Social History Social History   Tobacco Use  . Smoking status: Never Smoker  . Smokeless tobacco: Never Used  Substance Use Topics  . Alcohol use: Never    Frequency: Never  . Drug use: No    Review of Systems  All other systems negative except as documented in the HPI. All pertinent positives and negatives as reviewed in the HPI. ____________________________________________   PHYSICAL EXAM:  VITAL SIGNS: ED Triage Vitals  Enc Vitals Group     BP 06/01/18 0328 (!) 149/78     Pulse Rate 06/01/18 0328 76     Resp 06/01/18 0328 16     Temp 06/01/18 0328 98.1 F (36.7 C)     Temp Source 06/01/18 0328 Oral     SpO2 06/01/18 0328 97 %     Weight 06/01/18 0331 164 lb (74.4 kg)     Height 06/01/18 0331 5\' 2"  (1.575 m)    Constitutional: Alert and oriented. Well appearing and in no acute distress. Eyes: Conjunctivae are normal. PERRL. EOMI. Head:  Atraumatic. Nose: No congestion/rhinnorhea. Mouth/Throat: Mucous membranes are moist.  Oropharynx non-erythematous. Neck: No stridor.  No meningeal signs.   Cardiovascular: Normal rate, regular rhythm. Good peripheral circulation. Intact/symmetric DP pulses.  Grossly normal heart sounds.   Respiratory: Normal respiratory effort.  No retractions. Lungs CTAB. Gastrointestinal: Soft and nontender. No distention.  Musculoskeletal: No lower extremity tenderness nor edema. No gross deformities of extremities. Neurologic:  Normal speech and language. No gross focal neurologic deficits are appreciated.  Skin:  Skin is warm, dry and intact. No rash noted.   ____________________________________________   LABS (all labs ordered are listed, but only abnormal results are displayed)  Labs Reviewed  I-STAT CHEM 8, ED - Abnormal; Notable for the following components:      Result Value   Glucose, Bld 127 (*)    All other components within normal limits   ____________________________________________    INITIAL IMPRESSION / ASSESSMENT AND PLAN / ED COURSE  Unclear on etiology the patient's symptoms.  Could be related to could also be DVT.  Will return tomorrow for ultrasound.  Lecture lites normal.  Blood pressure slightly elevated but this is near her baseline and is on medications.  Doubt neuropathy at this point.  Low suspicion for spinal lesion.  Pertinent labs & imaging results that were available during my care of the patient were reviewed by  me and considered in my medical decision making (see chart for details).  ____________________________________________  FINAL CLINICAL IMPRESSION(S) / ED DIAGNOSES  Final diagnoses:  Abnormal sensation of leg     MEDICATIONS GIVEN DURING THIS VISIT:  Medications - No data to display   NEW OUTPATIENT MEDICATIONS STARTED DURING THIS VISIT:  New Prescriptions   No medications on file    Note:  This note was prepared with assistance of  Dragon voice recognition software. Occasional wrong-word or sound-a-like substitutions may have occurred due to the inherent limitations of voice recognition software.   Jorell Agne, Barbara Cower, MD 06/01/18 [phone]

## 2018-06-01 NOTE — ED Triage Notes (Signed)
Pt reports bilateral leg pain that has been going on for the past week. At times pt states it feels like something is moving in her legs.  Since November pt has been getting bitten by ants.  Pt states that her right knee is swollen.  Pt denies any falls.  Pt a/o x 4 and ambulatory.

## 2019-08-15 ENCOUNTER — Emergency Department (INDEPENDENT_AMBULATORY_CARE_PROVIDER_SITE_OTHER): Payer: Federal, State, Local not specified - PPO

## 2019-08-15 ENCOUNTER — Other Ambulatory Visit: Payer: Self-pay

## 2019-08-15 ENCOUNTER — Encounter: Payer: Self-pay | Admitting: Emergency Medicine

## 2019-08-15 ENCOUNTER — Emergency Department (INDEPENDENT_AMBULATORY_CARE_PROVIDER_SITE_OTHER)
Admission: EM | Admit: 2019-08-15 | Discharge: 2019-08-15 | Disposition: A | Discharge status: Home / Self Care | Payer: Self-pay | Source: Home / Self Care | Attending: Family Medicine | Admitting: Family Medicine

## 2019-08-15 DIAGNOSIS — M7711 Lateral epicondylitis, right elbow: Secondary | ICD-10-CM

## 2019-08-15 DIAGNOSIS — M25521 Pain in right elbow: Secondary | ICD-10-CM | POA: Diagnosis not present

## 2019-08-15 NOTE — Discharge Instructions (Addendum)
Apply ice pack for 20 to 30 minutes, 2 to 3 times daily. Continue until pain and swelling decrease.  Wear elbow brace during the day.  May take Ibuprofen 200mg , 4 tabs every 8 hours with food.  Begin range of motion and stretching exercises as tolerated.
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
